# Patient Record
Sex: Female | Born: 1975 | Race: Black or African American | Hispanic: No | Marital: Single | Smoking: Never smoker
Health system: Southern US, Community
[De-identification: ages and names within clinical notes are randomized; demographics above are authoritative.]

## PROBLEM LIST (undated history)

## (undated) DIAGNOSIS — O1494 Unspecified pre-eclampsia, complicating childbirth: Secondary | ICD-10-CM

## (undated) HISTORY — DX: Unspecified pre-eclampsia, complicating childbirth: O14.94

---

## 1998-12-08 ENCOUNTER — Emergency Department (HOSPITAL_COMMUNITY): Admission: EM | Admit: 1998-12-08 | Discharge: 1998-12-08 | Payer: Self-pay | Admitting: Emergency Medicine

## 1998-12-19 ENCOUNTER — Emergency Department (HOSPITAL_COMMUNITY): Admission: EM | Admit: 1998-12-19 | Discharge: 1998-12-19 | Payer: Self-pay | Admitting: Emergency Medicine

## 1999-01-04 ENCOUNTER — Inpatient Hospital Stay (HOSPITAL_COMMUNITY): Admission: AD | Admit: 1999-01-04 | Discharge: 1999-01-04 | Payer: Self-pay | Admitting: *Deleted

## 1999-01-04 ENCOUNTER — Encounter: Payer: Self-pay | Admitting: *Deleted

## 1999-01-07 ENCOUNTER — Inpatient Hospital Stay (HOSPITAL_COMMUNITY): Admission: AD | Admit: 1999-01-07 | Discharge: 1999-01-07 | Payer: Self-pay | Admitting: Obstetrics

## 1999-01-07 ENCOUNTER — Inpatient Hospital Stay (HOSPITAL_COMMUNITY): Admission: AD | Admit: 1999-01-07 | Discharge: 1999-01-07 | Payer: Self-pay | Admitting: Obstetrics & Gynecology

## 2003-05-13 ENCOUNTER — Encounter: Payer: Self-pay | Admitting: Emergency Medicine

## 2003-05-13 ENCOUNTER — Inpatient Hospital Stay (HOSPITAL_COMMUNITY): Admission: AD | Admit: 2003-05-13 | Discharge: 2003-05-13 | Payer: Self-pay | Admitting: Obstetrics and Gynecology

## 2003-05-15 ENCOUNTER — Inpatient Hospital Stay (HOSPITAL_COMMUNITY): Admission: AD | Admit: 2003-05-15 | Discharge: 2003-05-15 | Payer: Self-pay | Admitting: Obstetrics and Gynecology

## 2003-05-18 ENCOUNTER — Inpatient Hospital Stay (HOSPITAL_COMMUNITY): Admission: AD | Admit: 2003-05-18 | Discharge: 2003-05-18 | Payer: Self-pay | Admitting: Obstetrics & Gynecology

## 2003-05-25 ENCOUNTER — Inpatient Hospital Stay (HOSPITAL_COMMUNITY): Admission: AD | Admit: 2003-05-25 | Discharge: 2003-05-25 | Payer: Self-pay | Admitting: Obstetrics & Gynecology

## 2003-06-12 ENCOUNTER — Inpatient Hospital Stay (HOSPITAL_COMMUNITY): Admission: AD | Admit: 2003-06-12 | Discharge: 2003-06-12 | Payer: Self-pay | Admitting: Obstetrics & Gynecology

## 2004-09-14 ENCOUNTER — Emergency Department (HOSPITAL_COMMUNITY): Admission: EM | Admit: 2004-09-14 | Discharge: 2004-09-14 | Payer: Self-pay | Admitting: Emergency Medicine

## 2005-01-06 ENCOUNTER — Emergency Department (HOSPITAL_COMMUNITY): Admission: EM | Admit: 2005-01-06 | Discharge: 2005-01-06 | Payer: Self-pay | Admitting: Emergency Medicine

## 2005-01-25 ENCOUNTER — Emergency Department (HOSPITAL_COMMUNITY): Admission: EM | Admit: 2005-01-25 | Discharge: 2005-01-25 | Payer: Self-pay | Admitting: Emergency Medicine

## 2005-02-27 ENCOUNTER — Emergency Department (HOSPITAL_COMMUNITY): Admission: EM | Admit: 2005-02-27 | Discharge: 2005-02-27 | Payer: Self-pay | Admitting: Emergency Medicine

## 2006-09-10 ENCOUNTER — Inpatient Hospital Stay (HOSPITAL_COMMUNITY): Admission: AD | Admit: 2006-09-10 | Discharge: 2006-09-10 | Payer: Self-pay | Admitting: Obstetrics & Gynecology

## 2006-09-19 ENCOUNTER — Inpatient Hospital Stay (HOSPITAL_COMMUNITY): Admission: AD | Admit: 2006-09-19 | Discharge: 2006-09-19 | Payer: Self-pay | Admitting: Gynecology

## 2006-10-23 ENCOUNTER — Inpatient Hospital Stay (HOSPITAL_COMMUNITY): Admission: AD | Admit: 2006-10-23 | Discharge: 2006-10-23 | Payer: Self-pay | Admitting: Obstetrics and Gynecology

## 2006-10-26 ENCOUNTER — Ambulatory Visit: Payer: Self-pay | Admitting: Obstetrics & Gynecology

## 2006-11-13 ENCOUNTER — Encounter: Payer: Self-pay | Admitting: Family Medicine

## 2006-11-13 ENCOUNTER — Ambulatory Visit: Payer: Self-pay | Admitting: Family Medicine

## 2006-11-30 ENCOUNTER — Ambulatory Visit: Payer: Self-pay | Admitting: Family Medicine

## 2006-11-30 ENCOUNTER — Ambulatory Visit (HOSPITAL_COMMUNITY): Admission: RE | Admit: 2006-11-30 | Discharge: 2006-11-30 | Payer: Self-pay | Admitting: Obstetrics and Gynecology

## 2006-12-17 ENCOUNTER — Encounter: Admission: RE | Admit: 2006-12-17 | Discharge: 2006-12-17 | Payer: Self-pay | Admitting: Obstetrics & Gynecology

## 2007-01-14 ENCOUNTER — Ambulatory Visit: Payer: Self-pay | Admitting: Gynecology

## 2007-01-26 ENCOUNTER — Ambulatory Visit: Payer: Self-pay | Admitting: *Deleted

## 2007-01-26 ENCOUNTER — Inpatient Hospital Stay (HOSPITAL_COMMUNITY): Admission: AD | Admit: 2007-01-26 | Discharge: 2007-01-26 | Payer: Self-pay | Admitting: Obstetrics & Gynecology

## 2007-01-30 ENCOUNTER — Ambulatory Visit: Payer: Self-pay | Admitting: Obstetrics & Gynecology

## 2007-02-11 ENCOUNTER — Ambulatory Visit: Payer: Self-pay | Admitting: *Deleted

## 2007-02-11 ENCOUNTER — Inpatient Hospital Stay (HOSPITAL_COMMUNITY): Admission: AD | Admit: 2007-02-11 | Discharge: 2007-02-11 | Payer: Self-pay | Admitting: Family Medicine

## 2007-02-25 ENCOUNTER — Ambulatory Visit: Payer: Self-pay | Admitting: Gynecology

## 2007-03-14 ENCOUNTER — Ambulatory Visit: Payer: Self-pay | Admitting: Obstetrics and Gynecology

## 2007-03-14 ENCOUNTER — Inpatient Hospital Stay (HOSPITAL_COMMUNITY): Admission: AD | Admit: 2007-03-14 | Discharge: 2007-03-14 | Payer: Self-pay | Admitting: Obstetrics & Gynecology

## 2007-03-18 ENCOUNTER — Ambulatory Visit: Payer: Self-pay | Admitting: Gynecology

## 2007-04-01 ENCOUNTER — Ambulatory Visit: Payer: Self-pay | Admitting: Gynecology

## 2007-04-09 ENCOUNTER — Inpatient Hospital Stay (HOSPITAL_COMMUNITY): Admission: AD | Admit: 2007-04-09 | Discharge: 2007-04-09 | Payer: Self-pay | Admitting: Obstetrics & Gynecology

## 2007-04-09 ENCOUNTER — Ambulatory Visit: Payer: Self-pay | Admitting: Obstetrics and Gynecology

## 2007-04-11 ENCOUNTER — Ambulatory Visit: Payer: Self-pay | Admitting: Obstetrics & Gynecology

## 2007-04-17 ENCOUNTER — Ambulatory Visit: Payer: Self-pay | Admitting: Obstetrics & Gynecology

## 2007-04-23 ENCOUNTER — Ambulatory Visit: Payer: Self-pay | Admitting: Family Medicine

## 2007-04-23 ENCOUNTER — Inpatient Hospital Stay (HOSPITAL_COMMUNITY): Admission: AD | Admit: 2007-04-23 | Discharge: 2007-04-25 | Payer: Self-pay | Admitting: Gynecology

## 2007-04-30 ENCOUNTER — Ambulatory Visit: Payer: Self-pay | Admitting: Family Medicine

## 2007-06-04 ENCOUNTER — Ambulatory Visit: Payer: Self-pay | Admitting: Family Medicine

## 2007-06-05 ENCOUNTER — Ambulatory Visit: Payer: Self-pay | Admitting: Obstetrics & Gynecology

## 2007-07-15 ENCOUNTER — Ambulatory Visit: Payer: Self-pay | Admitting: Gynecology

## 2008-02-15 ENCOUNTER — Emergency Department (HOSPITAL_COMMUNITY): Admission: EM | Admit: 2008-02-15 | Discharge: 2008-02-15 | Payer: Self-pay | Admitting: Emergency Medicine

## 2008-04-25 ENCOUNTER — Emergency Department (HOSPITAL_COMMUNITY): Admission: EM | Admit: 2008-04-25 | Discharge: 2008-04-25 | Payer: Self-pay | Admitting: Emergency Medicine

## 2010-12-18 ENCOUNTER — Encounter: Payer: Self-pay | Admitting: Obstetrics & Gynecology

## 2011-04-11 NOTE — Assessment & Plan Note (Signed)
NAMEMARYELIZABETH, Bridget Ortiz NO.:  [id number]   MEDICAL RECORD NO.:  [id number]          PATIENT TYPE:  POB   LOCATION:  [facility] at [facility]         FACILITY:  [facility]   PHYSICIAN:  Tinnie Gens, MD        DATE OF BIRTH:  [birth year]   DATE OF SERVICE:  06/04/2007                                  CLINIC NOTE   CHIEF COMPLAINT:  Postpartum check   HISTORY OF PRESENT ILLNESS:  The patient is a 35 year old gravida 2,  para 2 who is 7 weeks postpartum from a vaginal delivery of a female  infant. The baby is not sleeping well through the night, and she is  feeling exceptionally fatigued.  She denies depression or anxiety.  She  is bottle feeding.  She is on no birth control.  She has had sex one-  time with the use of condoms.  She desires an IUD insertion and states  that she is scheduled to return for that tomorrow.   ON EXAM:  Her vitals are as in the chart.  GENERAL:  She is well-nourished female in no acute stress.  ABDOMEN:  Soft, nontender, nondistended.  GU:  Normal external female genitalia.  BUS is normal.  Vagina is pink  and rugated.  Cervix is parous without lesions.  Uterus small,  anteverted.  ADNEXA:  No adnexal mass or tenderness.   IMPRESSION:  Postpartum check, doing well.   PLAN:  IUD insertion.  The patient is cautioned not to have sex without  the use of a condom until her IUD is inserted. The patient also needs a  Pap smear December 2008.           ______________________________  Tinnie Gens, MD     TP/MEDQ  D:  06/04/2007  T:  06/04/2007  Job:  [id number]

## 2011-05-03 ENCOUNTER — Ambulatory Visit: Payer: Self-pay | Admitting: Obstetrics & Gynecology

## 2011-06-14 ENCOUNTER — Ambulatory Visit (INDEPENDENT_AMBULATORY_CARE_PROVIDER_SITE_OTHER): Payer: Self-pay | Admitting: *Deleted

## 2011-06-14 ENCOUNTER — Encounter: Payer: Self-pay | Admitting: Obstetrics & Gynecology

## 2011-06-14 VITALS — BP 110/70 | Ht 66.0 in | Wt 162.0 lb

## 2011-06-14 DIAGNOSIS — Z0184 Encounter for antibody response examination: Secondary | ICD-10-CM

## 2011-06-14 DIAGNOSIS — Z0000 Encounter for general adult medical examination without abnormal findings: Secondary | ICD-10-CM

## 2011-06-14 DIAGNOSIS — Z1272 Encounter for screening for malignant neoplasm of vagina: Secondary | ICD-10-CM

## 2011-06-14 DIAGNOSIS — Z111 Encounter for screening for respiratory tuberculosis: Secondary | ICD-10-CM

## 2011-06-14 DIAGNOSIS — Z01419 Encounter for gynecological examination (general) (routine) without abnormal findings: Secondary | ICD-10-CM

## 2011-06-14 DIAGNOSIS — Z23 Encounter for immunization: Secondary | ICD-10-CM

## 2011-06-14 NOTE — Progress Notes (Signed)
  Subjective:     Bridget Ortiz is a 35 y.o. female here for a routine exam.  Current complaints: None.   Gynecologic History No LMP recorded. Patient is not currently having periods (Reason: IUD). Contraception: IUD Last Pap: 4 years ago. Results were: normal.  Obstetric History OB History    Grav Para Term Preterm Abortions TAB SAB Ect Mult Living   2 2 2  0 0 0 0 0 0 2     # Outc Date GA Lbr Len/2nd Wgt Sex Del Anes PTL Lv   1 TRM      SVD      2 TRM      SVD        PMH/PSH: None  Review of Systems A comprehensive review of systems was negative.    Objective:    BP 110/70  Ht 5\' 6"  (1.676 m)  Wt 162 lb (73.483 kg)  BMI 26.15 kg/m2  General Appearance:    Alert, cooperative, no distress, appears stated age  Head:    Normocephalic, without obvious abnormality, atraumatic  Eyes:    PERRL, conjunctiva/corneas clear, EOM's intact, fundi    benign, both eyes  Ears:    Normal TM's and external ear canals, both ears  Nose:   Nares normal, septum midline, mucosa normal, no drainage    or sinus tenderness  Throat:   Lips, mucosa, and tongue normal; teeth and gums normal  Neck:   Supple, symmetrical, trachea midline, no adenopathy;    thyroid:  no enlargement/tenderness/nodules; no carotid   bruit or JVD  Back:     Symmetric, no curvature, ROM normal, no CVA tenderness  Lungs:     Clear to auscultation bilaterally, respirations unlabored  Chest Wall:    No tenderness or deformity   Heart:    Regular rate and rhythm, S1 and S2 normal, no murmur, rub   or gallop  Breast Exam:    No tenderness, masses, or nipple abnormality  Abdomen:     Soft, non-tender, bowel sounds active all four quadrants,    no masses, no organomegaly  Genitalia:    Normal female without lesion, discharge or tenderness   Normal uterus, normal adnexa, no tenderness or abnormal masses  Rectal:    Normal tone, normal prostate, no masses or tenderness;   guaiac negative stool  Extremities:   Extremities  normal, atraumatic, no cyanosis or edema  Pulses:   2+ and symmetric all extremities  Skin:   Skin color, texture, turgor normal, no rashes or lesions  Lymph nodes:   Cervical, supraclavicular, and axillary nodes normal  Neurologic:   CNII-XII intact, normal strength, sensation and reflexes    throughout      Assessment:    Healthy female exam.    Plan:    Contraception: IUD.   Mirena IUD to be removed June 2013.

## 2011-06-14 NOTE — Patient Instructions (Addendum)

## 2011-06-26 ENCOUNTER — Ambulatory Visit (INDEPENDENT_AMBULATORY_CARE_PROVIDER_SITE_OTHER): Payer: 59 | Admitting: *Deleted

## 2011-06-26 DIAGNOSIS — Z23 Encounter for immunization: Secondary | ICD-10-CM

## 2011-08-21 LAB — RAPID STREP SCREEN (MED CTR MEBANE ONLY): Streptococcus, Group A Screen (Direct): NEGATIVE

## 2011-08-23 LAB — RAPID STREP SCREEN (MED CTR MEBANE ONLY): Streptococcus, Group A Screen (Direct): POSITIVE — AB

## 2011-09-25 ENCOUNTER — Ambulatory Visit: Payer: 59

## 2012-05-09 ENCOUNTER — Encounter: Payer: Self-pay | Admitting: Obstetrics & Gynecology

## 2012-05-09 ENCOUNTER — Ambulatory Visit (INDEPENDENT_AMBULATORY_CARE_PROVIDER_SITE_OTHER): Payer: 59 | Admitting: Obstetrics & Gynecology

## 2012-05-09 VITALS — BP 98/70 | HR 81 | Ht 66.0 in | Wt 180.0 lb

## 2012-05-09 DIAGNOSIS — Z30433 Encounter for removal and reinsertion of intrauterine contraceptive device: Secondary | ICD-10-CM

## 2012-05-09 DIAGNOSIS — Z975 Presence of (intrauterine) contraceptive device: Secondary | ICD-10-CM

## 2012-05-09 NOTE — Progress Notes (Signed)
  Subjective:    Patient ID: Bridget Ortiz, female    DOB: July 04, 1976, 36 y.o.   MRN: [id number]  HPI  She is here for removal of old Mirena and insertion of new one. We discussed a BTL as she want no more children, but she has a h/o dysmenorrhea and likes the effect of Mirena on her periods (She spots for a few days each month). She has no complaints today.  Review of Systems Her pap was normal 2012.    Objective:   Physical Exam  Consent was signed. Questions were answered. Her old Mirena was removed intact. Her cervix was prepped with betadine. The new Mirena was easily placed. The strings were cut to 3-4 cm. She tolerated the procedure well.     Assessment & Plan:  Contraception- Mirena RTC 1 month for string check

## 2012-06-06 ENCOUNTER — Ambulatory Visit: Payer: 59 | Admitting: Obstetrics & Gynecology

## 2012-06-28 ENCOUNTER — Ambulatory Visit: Payer: 59 | Admitting: Family Medicine

## 2014-09-28 ENCOUNTER — Encounter: Payer: Self-pay | Admitting: Obstetrics & Gynecology

## 2015-01-21 ENCOUNTER — Encounter (HOSPITAL_COMMUNITY): Payer: Self-pay | Admitting: Emergency Medicine

## 2015-01-21 ENCOUNTER — Emergency Department (HOSPITAL_COMMUNITY)
Admission: EM | Admit: 2015-01-21 | Discharge: 2015-01-21 | Disposition: A | Discharge status: Home / Self Care | Payer: 59 | Attending: Emergency Medicine | Admitting: Emergency Medicine

## 2015-01-21 DIAGNOSIS — Z793 Long term (current) use of hormonal contraceptives: Secondary | ICD-10-CM | POA: Diagnosis not present

## 2015-01-21 DIAGNOSIS — J069 Acute upper respiratory infection, unspecified: Secondary | ICD-10-CM | POA: Diagnosis not present

## 2015-01-21 DIAGNOSIS — R05 Cough: Secondary | ICD-10-CM | POA: Diagnosis present

## 2015-01-21 MED ORDER — GUAIFENESIN-CODEINE 100-10 MG/5ML PO SOLN: 10.0000 mL | Freq: Four times a day (QID) | ORAL | Status: AC | PRN

## 2015-01-21 NOTE — ED Provider Notes (Signed)
CSN: [id number]     Arrival date & time 01/21/15  0006 History  This chart was scribed for Geoffery Lyonsouglas , MD by Luisa DagoPriscilla Tutu, ED Scribe. This patient was seen in room A06C/A06C and the patient's care was started at 1:26 AM.   Chief Complaint  Patient presents with  . Cough  . Generalized Body Aches   The history is provided by the patient and medical records. No language interpreter was used.   HPI Comments: Ephriam Knucklesnita D Tierney is a 39 y.o. female who presents to the Emergency Department complaining of gradual onset persistent productive cough which started 4 days ago. She states that her symptoms started with a "scratchy" throat but now she has generalized myalgias and a mild HA. Pt reports taking Ibuprofen and keeping hydrated to alleviate her symptoms. She states that she was concerned because her symptoms have been persistent and not resolving. Ms. Dimas AguasHoward endorses multiple possible sick contacts. Pt denies any fever, neck pain,  visual disturbance, CP, SOB, abdominal pain, nausea, emesis, diarrhea, urinary symptoms, back pain, HA, weakness, numbness and rash as associated symptoms.     Past Medical History  Diagnosis Date  . Hypertension in pregnancy, preeclampsia, delivered    History reviewed. No pertinent past surgical history. Family History  Problem Relation Age of Onset  . Diabetes Mother   . Hypertension Mother   . Cancer Mother     lympnodes  . Heart disease Father   . Cancer Maternal Grandfather     lung   History  Substance Use Topics  . Smoking status: Never Smoker   . Smokeless tobacco: Not on file  . Alcohol Use: 0.0 oz/week    1-2 Glasses of wine per week     Comment: occassionally   OB History    Gravida Para Term Preterm AB TAB SAB Ectopic Multiple Living   2 2 2  0 0 0 0 0 0 2     Review of Systems  Constitutional: Negative for fever and chills.  HENT: Negative for rhinorrhea and sore throat.   Eyes: Negative for visual disturbance.  Respiratory: Negative  for cough and shortness of breath.   Cardiovascular: Negative for chest pain and leg swelling.  Gastrointestinal: Negative for nausea, vomiting, abdominal pain and diarrhea.  Genitourinary: Negative for dysuria.  Musculoskeletal: Negative for back pain and neck pain.  Skin: Negative for rash.  Neurological: Negative for dizziness, light-headedness and headaches.  Hematological: Does not bruise/bleed easily.  Psychiatric/Behavioral: Negative for confusion.      Allergies  Review of patient's allergies indicates no known allergies.  Home Medications   Prior to Admission medications   Medication Sig Start Date End Date Taking? Authorizing Provider  levonorgestrel (MIRENA) 20 MCG/24HR IUD 1 each by Intrauterine route once.    Historical Provider, MD   BP 128/67 mmHg  Pulse 84  Temp(Src) 98.8 F (37.1 C)  Resp 20  SpO2 97%  Physical Exam  Constitutional: She is oriented to person, place, and time. She appears well-developed and well-nourished. No distress.  HENT:  Head: Normocephalic and atraumatic.  Right Ear: External ear normal.  Left Ear: External ear normal.  Nose: Nose normal.  Mouth/Throat: Oropharynx is clear and moist. No oropharyngeal exudate.  Eyes: Conjunctivae and EOM are normal. Pupils are equal, round, and reactive to light. Right eye exhibits no discharge. Left eye exhibits no discharge.  Neck: Neck supple.  Cardiovascular: Normal rate, regular rhythm and normal heart sounds.  Exam reveals no gallop and no friction rub.  No murmur heard. Pulmonary/Chest: Effort normal and breath sounds normal. No respiratory distress. She has no wheezes. She has no rales.  Abdominal: Soft. Bowel sounds are normal. She exhibits no distension. There is no tenderness.  Musculoskeletal: She exhibits no edema or tenderness.  Neurological: She is alert and oriented to person, place, and time.  Skin: Skin is warm and dry.  Psychiatric: She has a normal mood and affect. Her behavior  is normal. Thought content normal.  Nursing note and vitals reviewed.   ED Course  Procedures (including critical care time)  DIAGNOSTIC STUDIES: Oxygen Saturation is 97% on RA, adequate by my interpretation.    COORDINATION OF CARE: 1:30 AM- Advised pt to keep hydrated. Will prescribe cough medicine. Pt advised of plan for treatment and pt agrees.  Labs Review Labs Reviewed - No data to display  Imaging Review No results found.   EKG Interpretation None      MDM   Final diagnoses:  None    Symptoms likely viral in nature. There is no hypoxia, respiratory distress, and lungs are clear. Will treat with cough medication and when necessary return.  I personally performed the services described in this documentation, which was scribed in my presence. The recorded information has been reviewed and is accurate.      Geoffery Lyons, MD 01/21/15 [phone]

## 2015-01-21 NOTE — ED Notes (Signed)
Pt c/o productive cough, body aches, headache since Sunday. Pt taking ibuprofen at home

## 2015-01-21 NOTE — Discharge Instructions (Signed)
Robitussin with codeine as needed for cough.  Return to the emergency department if you develop chest pain, difficulty breathing, or other new and concerning symptoms.   Upper Respiratory Infection, Adult An upper respiratory infection (URI) is also sometimes known as the common cold. The upper respiratory tract includes the nose, sinuses, throat, trachea, and bronchi. Bronchi are the airways leading to the lungs. Most people improve within 1 week, but symptoms can last up to 2 weeks. A residual cough may last even longer.  CAUSES Many different viruses can infect the tissues lining the upper respiratory tract. The tissues become irritated and inflamed and often become very moist. Mucus production is also common. A cold is contagious. You can easily spread the virus to others by oral contact. This includes kissing, sharing a glass, coughing, or sneezing. Touching your mouth or nose and then touching a surface, which is then touched by another person, can also spread the virus. SYMPTOMS  Symptoms typically develop 1 to 3 days after you come in contact with a cold virus. Symptoms vary from person to person. They may include:  Runny nose.  Sneezing.  Nasal congestion.  Sinus irritation.  Sore throat.  Loss of voice (laryngitis).  Cough.  Fatigue.  Muscle aches.  Loss of appetite.  Headache.  Low-grade fever. DIAGNOSIS  You might diagnose your own cold based on familiar symptoms, since most people get a cold 2 to 3 times a year. Your caregiver can confirm this based on your exam. Most importantly, your caregiver can check that your symptoms are not due to another disease such as strep throat, sinusitis, pneumonia, asthma, or epiglottitis. Blood tests, throat tests, and X-rays are not necessary to diagnose a common cold, but they may sometimes be helpful in excluding other more serious diseases. Your caregiver will decide if any further tests are required. RISKS AND COMPLICATIONS    You may be at risk for a more severe case of the common cold if you smoke cigarettes, have chronic heart disease (such as heart failure) or lung disease (such as asthma), or if you have a weakened immune system. The very young and very old are also at risk for more serious infections. Bacterial sinusitis, middle ear infections, and bacterial pneumonia can complicate the common cold. The common cold can worsen asthma and chronic obstructive pulmonary disease (COPD). Sometimes, these complications can require emergency medical care and may be life-threatening. PREVENTION  The best way to protect against getting a cold is to practice good hygiene. Avoid oral or hand contact with people with cold symptoms. Wash your hands often if contact occurs. There is no clear evidence that vitamin C, vitamin E, echinacea, or exercise reduces the chance of developing a cold. However, it is always recommended to get plenty of rest and practice good nutrition. TREATMENT  Treatment is directed at relieving symptoms. There is no cure. Antibiotics are not effective, because the infection is caused by a virus, not by bacteria. Treatment may include:  Increased fluid intake. Sports drinks offer valuable electrolytes, sugars, and fluids.  Breathing heated mist or steam (vaporizer or shower).  Eating chicken soup or other clear broths, and maintaining good nutrition.  Getting plenty of rest.  Using gargles or lozenges for comfort.  Controlling fevers with ibuprofen or acetaminophen as directed by your caregiver.  Increasing usage of your inhaler if you have asthma. Zinc gel and zinc lozenges, taken in the first 24 hours of the common cold, can shorten the duration and lessen the  severity of symptoms. Pain medicines may help with fever, muscle aches, and throat pain. A variety of non-prescription medicines are available to treat congestion and runny nose. Your caregiver can make recommendations and may suggest nasal or  lung inhalers for other symptoms.  HOME CARE INSTRUCTIONS   Only take over-the-counter or prescription medicines for pain, discomfort, or fever as directed by your caregiver.  Use a warm mist humidifier or inhale steam from a shower to increase air moisture. This may keep secretions moist and make it easier to breathe.  Drink enough water and fluids to keep your urine clear or pale yellow.  Rest as needed.  Return to work when your temperature has returned to normal or as your caregiver advises. You may need to stay home longer to avoid infecting others. You can also use a face mask and careful hand washing to prevent spread of the virus. SEEK MEDICAL CARE IF:   After the first few days, you feel you are getting worse rather than better.  You need your caregiver's advice about medicines to control symptoms.  You develop chills, worsening shortness of breath, or brown or red sputum. These may be signs of pneumonia.  You develop yellow or brown nasal discharge or pain in the face, especially when you bend forward. These may be signs of sinusitis.  You develop a fever, swollen neck glands, pain with swallowing, or white areas in the back of your throat. These may be signs of strep throat. SEEK IMMEDIATE MEDICAL CARE IF:   You have a fever.  You develop severe or persistent headache, ear pain, sinus pain, or chest pain.  You develop wheezing, a prolonged cough, cough up blood, or have a change in your usual mucus (if you have chronic lung disease).  You develop sore muscles or a stiff neck. Document Released: 05/09/2001 Document Revised: 02/05/2012 Document Reviewed: 02/18/2014 [facility] Patient Information 2015 [place], [place]. This information is not intended to replace advice given to you by your health care provider. Make sure you discuss any questions you have with your health care provider.

## 2015-01-21 NOTE — ED Notes (Signed)
EDP at bedside  

## 2016-06-23 ENCOUNTER — Other Ambulatory Visit: Payer: Self-pay | Admitting: Obstetrics & Gynecology

## 2016-06-23 ENCOUNTER — Other Ambulatory Visit (HOSPITAL_COMMUNITY)
Admission: RE | Admit: 2016-06-23 | Discharge: 2016-06-23 | Disposition: A | Discharge status: Home / Self Care | Payer: BC Managed Care – PPO | Source: Ambulatory Visit | Attending: Obstetrics & Gynecology | Admitting: Obstetrics & Gynecology

## 2016-06-23 DIAGNOSIS — Z01419 Encounter for gynecological examination (general) (routine) without abnormal findings: Secondary | ICD-10-CM | POA: Diagnosis present

## 2016-06-23 DIAGNOSIS — Z1151 Encounter for screening for human papillomavirus (HPV): Secondary | ICD-10-CM | POA: Diagnosis present

## 2016-06-27 LAB — CYTOLOGY - PAP

## 2019-05-29 ENCOUNTER — Other Ambulatory Visit: Payer: Self-pay | Admitting: Family Medicine

## 2019-05-29 DIAGNOSIS — Z1231 Encounter for screening mammogram for malignant neoplasm of breast: Secondary | ICD-10-CM

## 2019-07-10 ENCOUNTER — Ambulatory Visit
Admission: RE | Admit: 2019-07-10 | Discharge: 2019-07-10 | Disposition: A | Discharge status: Home / Self Care | Payer: BC Managed Care – PPO | Source: Ambulatory Visit | Attending: Family Medicine | Admitting: Family Medicine

## 2019-07-10 ENCOUNTER — Other Ambulatory Visit: Payer: Self-pay

## 2019-07-10 DIAGNOSIS — Z1231 Encounter for screening mammogram for malignant neoplasm of breast: Secondary | ICD-10-CM

## 2019-11-10 IMAGING — MG DIGITAL SCREENING BILATERAL MAMMOGRAM WITH TOMO AND CAD
8 series · 9 of 24 positions shown · non-contrast
Comparison: Previous exam(s).

CLINICAL DATA: Screening.

EXAM:
DIGITAL SCREENING BILATERAL MAMMOGRAM WITH TOMO AND CAD

[R CC synth-2D]
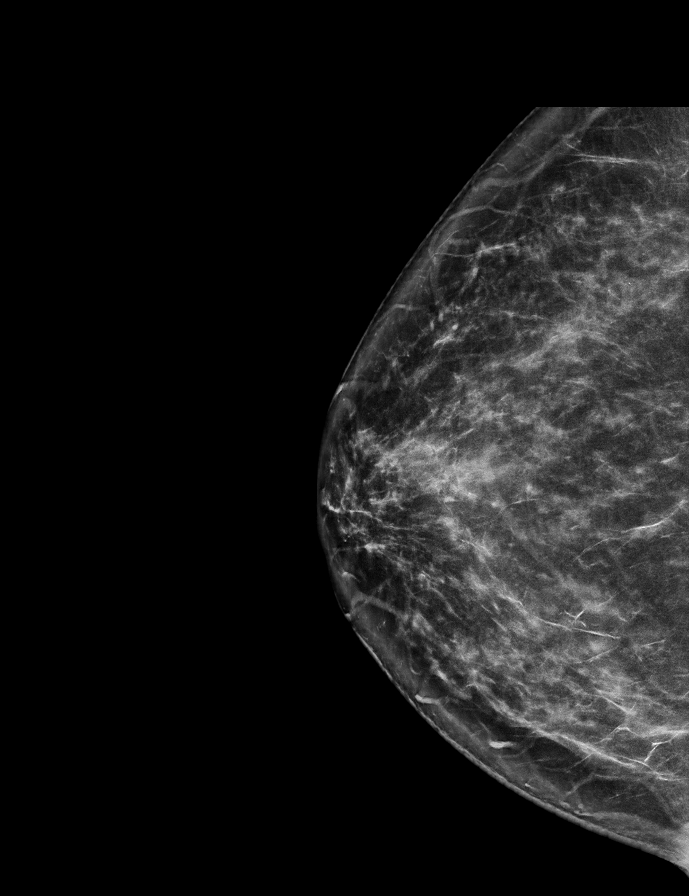

[L MLO synth-2D]
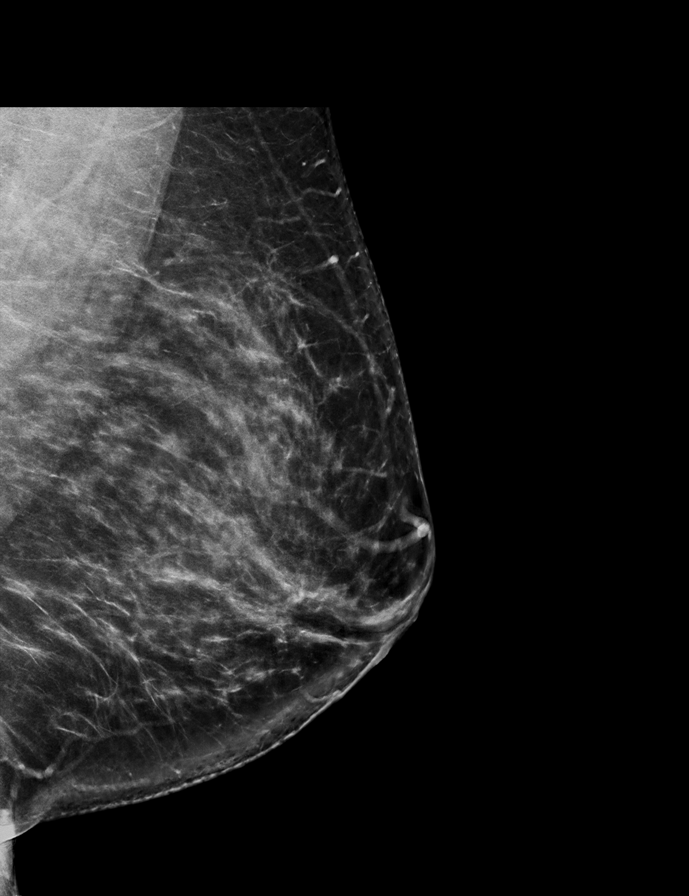

[R MLO synth-2D]
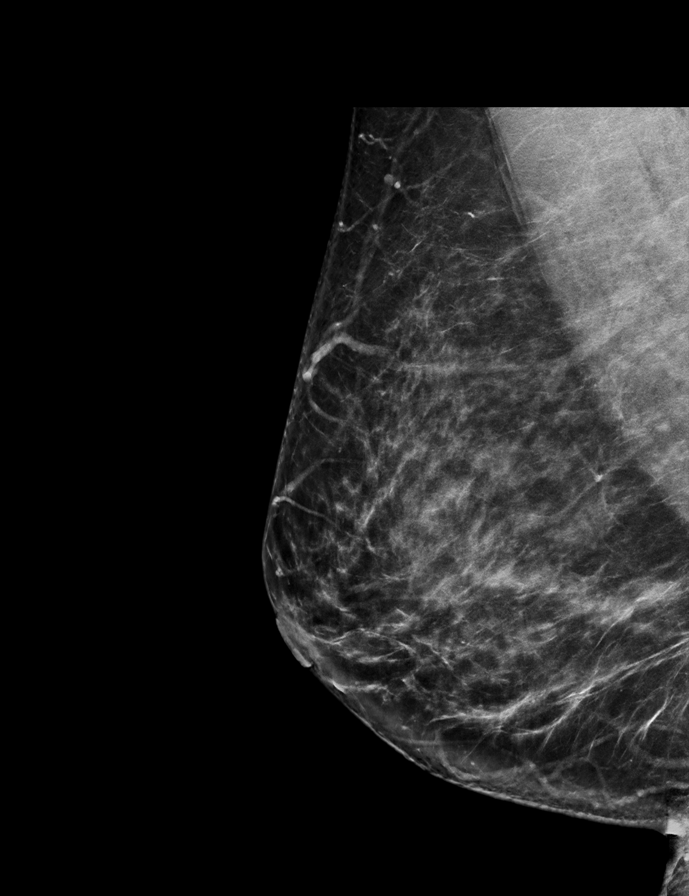

[L CC synth-2D]
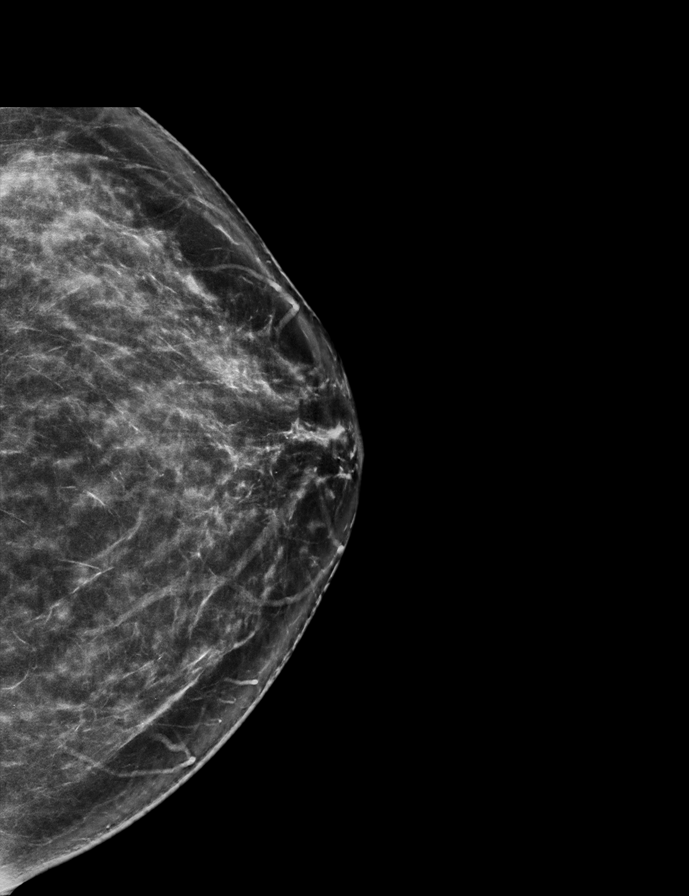

[R MLO tomo · 2 of 83 frames shown]
[frame 27/83]
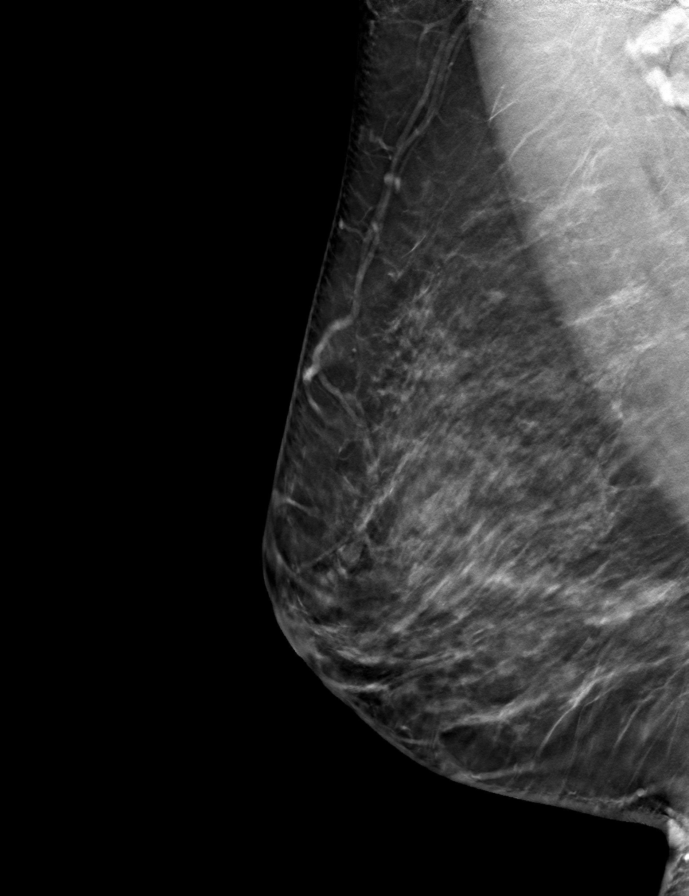
[frame 42/83]
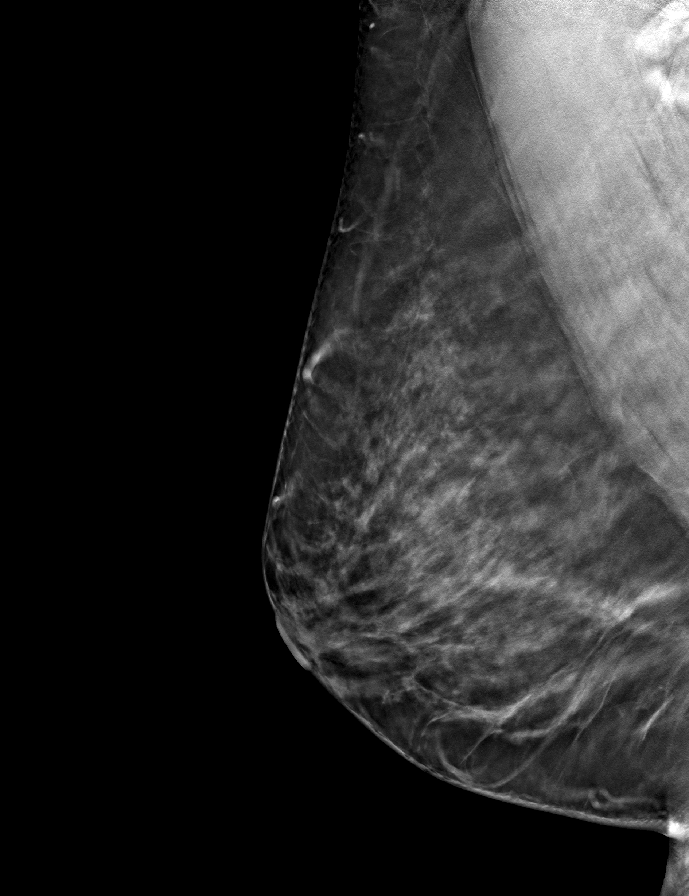

[R CC tomo · tomo slice 39/77.0]
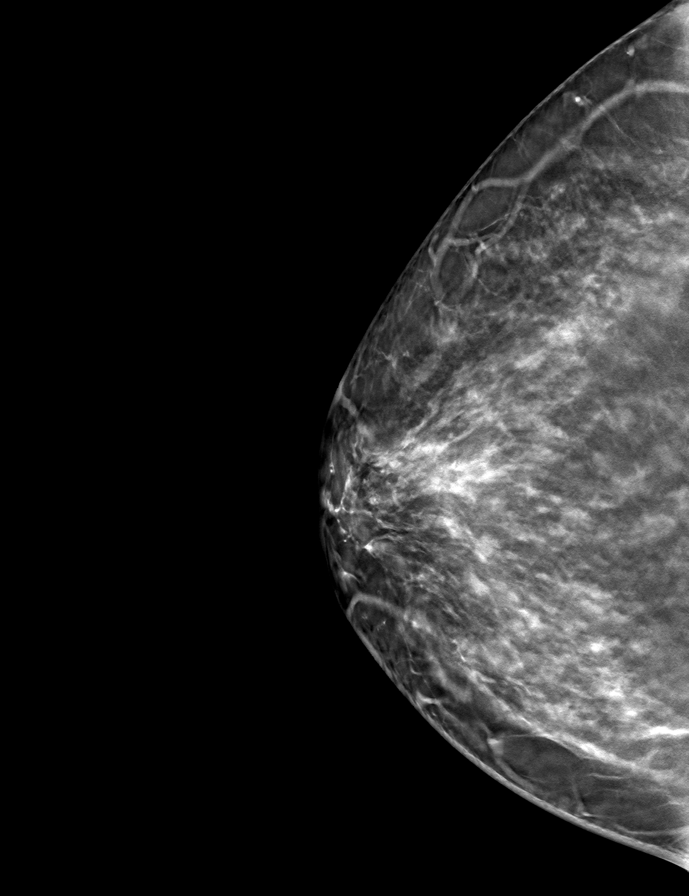

[L CC tomo · tomo slice 37/74.0]
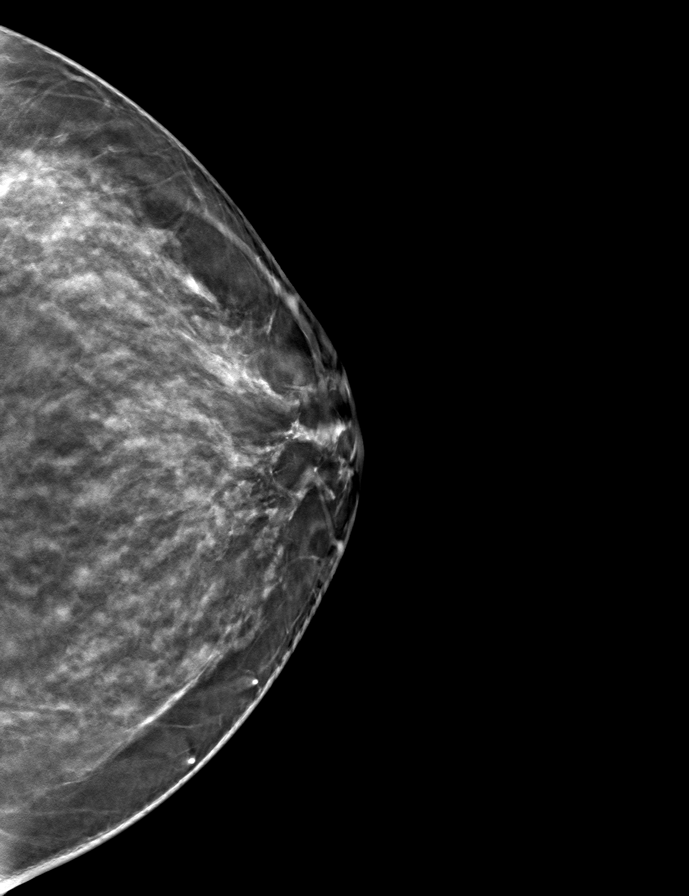

[L MLO tomo · tomo slice 43/84.0]
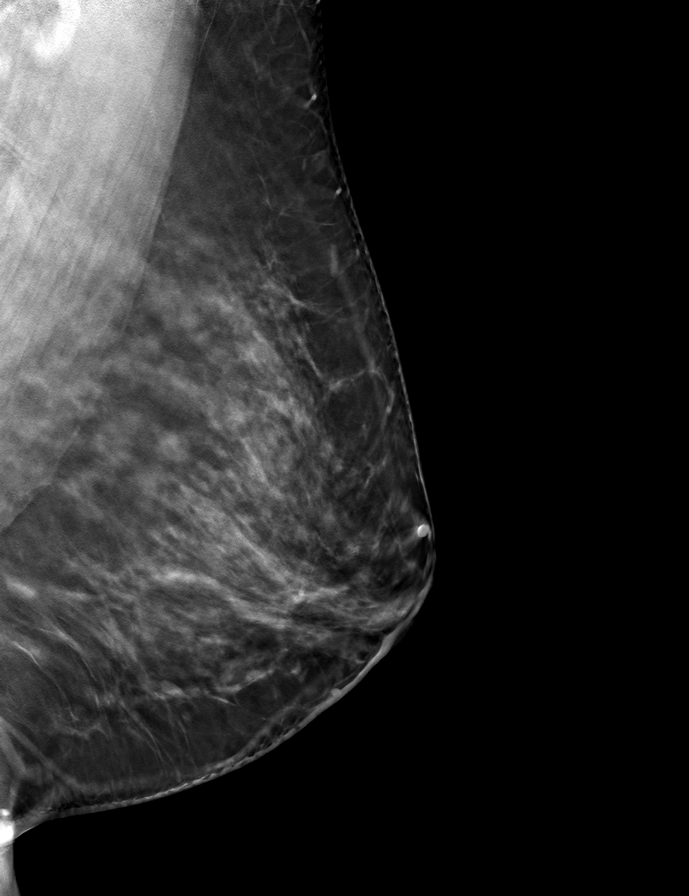

[9 of 24 positions shown; findings below may reference images not displayed]

ACR Breast Density Category c: The breast tissue is heterogeneously
dense, which may obscure small masses.
FINDINGS: There are no findings suspicious for malignancy. Images were
processed with CAD.
IMPRESSION: No mammographic evidence of malignancy. A result letter of this
screening mammogram will be mailed directly to the patient.

RECOMMENDATION:
Screening mammogram in one year. (Code:[id number])

BI-RADS CATEGORY  1: Negative.

## 2019-12-08 ENCOUNTER — Other Ambulatory Visit: Payer: Self-pay

## 2019-12-08 DIAGNOSIS — Z20822 Contact with and (suspected) exposure to covid-19: Secondary | ICD-10-CM

## 2019-12-10 LAB — NOVEL CORONAVIRUS, NAA: SARS-CoV-2, NAA: NOT DETECTED

## 2020-02-21 ENCOUNTER — Ambulatory Visit: Payer: BC Managed Care – PPO | Attending: Internal Medicine

## 2020-02-21 DIAGNOSIS — Z23 Encounter for immunization: Secondary | ICD-10-CM

## 2020-02-21 NOTE — Progress Notes (Signed)
   Covid-19 Vaccination Clinic  Name:  [name]    MRN: [id number] DOB: 20-Jul-1976  02/21/2020  Ms. Krigbaum was observed post Covid-19 immunization for 15 minutes without incident. She was provided with Vaccine Information Sheet and instruction to access the V-Safe system.   Ms. Bittick was instructed to call 911 with any severe reactions post vaccine: Marland Kitchen Difficulty breathing  . Swelling of face and throat  . A fast heartbeat  . A bad rash all over body  . Dizziness and weakness   Immunizations Administered    Name Date Dose VIS Date Route   Pfizer COVID-19 Vaccine 02/21/2020  1:54 PM 0.3 mL 11/07/2019 Intramuscular   Manufacturer: [employer], [employer]   Lot: [id number]   NDC: [address]-4

## 2020-03-17 ENCOUNTER — Ambulatory Visit: Payer: BC Managed Care – PPO | Attending: Internal Medicine

## 2020-03-17 DIAGNOSIS — Z23 Encounter for immunization: Secondary | ICD-10-CM

## 2020-03-17 NOTE — Progress Notes (Signed)
   Covid-19 Vaccination Clinic  Name:  Bridget Ortiz    MRN: [id number] DOB: 11-Mar-1976  03/17/2020  [name] was observed post Covid-19 immunization for 15 minutes without incident. She was provided with Vaccine Information Sheet and instruction to access the V-Safe system.   [name] was instructed to call 911 with any severe reactions post vaccine: Marland Kitchen Difficulty breathing  . Swelling of face and throat  . A fast heartbeat  . A bad rash all over body  . Dizziness and weakness   Immunizations Administered    Name Date Dose VIS Date Route   Pfizer COVID-19 Vaccine 03/17/2020  3:52 PM 0.3 mL 01/21/2019 Intramuscular   Manufacturer: [employer], [employer]   Lot: [id number]   NDC: [address]-2

## 2020-10-13 ENCOUNTER — Other Ambulatory Visit: Payer: Self-pay | Admitting: Family Medicine

## 2020-10-13 DIAGNOSIS — Z1231 Encounter for screening mammogram for malignant neoplasm of breast: Secondary | ICD-10-CM

## 2020-10-19 ENCOUNTER — Ambulatory Visit
Admission: RE | Admit: 2020-10-19 | Discharge: 2020-10-19 | Disposition: A | Discharge status: Home / Self Care | Payer: BC Managed Care – PPO | Source: Ambulatory Visit | Attending: Family Medicine | Admitting: Family Medicine

## 2020-10-19 ENCOUNTER — Other Ambulatory Visit: Payer: Self-pay

## 2020-10-19 DIAGNOSIS — Z1231 Encounter for screening mammogram for malignant neoplasm of breast: Secondary | ICD-10-CM

## 2020-12-03 ENCOUNTER — Ambulatory Visit: Payer: Self-pay | Attending: Internal Medicine

## 2020-12-03 DIAGNOSIS — Z23 Encounter for immunization: Secondary | ICD-10-CM

## 2020-12-03 NOTE — Progress Notes (Signed)
   Covid-19 Vaccination Clinic  Name:  Bridget Ortiz    MRN: [id number] DOB: 02-16-1976  12/03/2020  Ms. Bridget Ortiz was observed post Covid-19 immunization for 15 minutes without incident. She was provided with Vaccine Information Sheet and instruction to access the V-Safe system.   Ms. Bridget Ortiz was instructed to call 911 with any severe reactions post vaccine: Marland Kitchen Difficulty breathing  . Swelling of face and throat  . A fast heartbeat  . A bad rash all over body  . Dizziness and weakness   Immunizations Administered    Name Date Dose VIS Date Route   Pfizer COVID-19 Vaccine 12/03/2020  5:04 PM 0.3 mL 09/15/2020 Intramuscular   Manufacturer: [employer], [employer]   Lot: [id number]   NDC: [address]-5

## 2021-09-23 ENCOUNTER — Other Ambulatory Visit: Payer: Self-pay | Admitting: Family Medicine

## 2021-09-23 DIAGNOSIS — Z1231 Encounter for screening mammogram for malignant neoplasm of breast: Secondary | ICD-10-CM

## 2021-10-27 ENCOUNTER — Ambulatory Visit
Admission: RE | Admit: 2021-10-27 | Discharge: 2021-10-27 | Disposition: A | Discharge status: Home / Self Care | Payer: BC Managed Care – PPO | Source: Ambulatory Visit | Attending: Family Medicine | Admitting: Family Medicine

## 2021-10-27 DIAGNOSIS — Z1231 Encounter for screening mammogram for malignant neoplasm of breast: Secondary | ICD-10-CM

## 2023-01-11 ENCOUNTER — Other Ambulatory Visit: Payer: Self-pay | Admitting: Family Medicine

## 2023-01-11 DIAGNOSIS — Z1231 Encounter for screening mammogram for malignant neoplasm of breast: Secondary | ICD-10-CM

## 2023-03-05 ENCOUNTER — Ambulatory Visit
Admission: RE | Admit: 2023-03-05 | Discharge: 2023-03-05 | Disposition: A | Discharge status: Home / Self Care | Payer: BC Managed Care – PPO | Source: Ambulatory Visit | Attending: Family Medicine | Admitting: Family Medicine

## 2023-03-05 DIAGNOSIS — Z1231 Encounter for screening mammogram for malignant neoplasm of breast: Secondary | ICD-10-CM

## 2023-06-08 ENCOUNTER — Ambulatory Visit: Payer: BC Managed Care – PPO | Admitting: Podiatry

## 2023-06-28 ENCOUNTER — Ambulatory Visit: Payer: BC Managed Care – PPO | Admitting: Podiatry

## 2023-06-28 DIAGNOSIS — L603 Nail dystrophy: Secondary | ICD-10-CM | POA: Diagnosis not present

## 2023-06-28 DIAGNOSIS — C439 Malignant melanoma of skin, unspecified: Secondary | ICD-10-CM | POA: Diagnosis not present

## 2023-06-28 DIAGNOSIS — M25472 Effusion, left ankle: Secondary | ICD-10-CM | POA: Diagnosis not present

## 2023-06-28 MED ORDER — METHYLPREDNISOLONE 4 MG PO TBPK: ORAL_TABLET | ORAL | 0 refills | Status: AC

## 2023-06-28 NOTE — Progress Notes (Signed)
  Subjective:  Patient ID: Bridget Ortiz, female    DOB: 02/15/1976,  MRN: [id number]  Chief Complaint  Patient presents with   Nail Problem    Left hallux nail change, dark lines and a dark spot. No pain. No known injuries. Not diabetic.    Joint Swelling    Left ankle swelling during the day but subsides when she lays down. Denies any pain. Does not wear any compression socks or anklets.      47 y.o. female presents with concern for left hallux nail change.  There is dark line in a dark spot running in the nail.  She denies any pain in the left great toe.  No known injury to the toe.  She also notes some left ankle swelling during the day but subsides when she lays down.  She does not wear any compression socks or ankle brace.  Past Medical History:  Diagnosis Date   Hypertension in pregnancy, preeclampsia, delivered     Allergies  Allergen Reactions   Citalopram Hydrobromide Diarrhea    ROS: Negative except as per HPI above  Objective:  General: AAO x3, NAD  Dermatological:  attention directed to the left hallux there is noted to be black line discoloration running longitudinally down the central aspect of the left hallux nail.  There is also a black circular/oval shaped lesion in the left great toenail.  Upon debridement of some of the nail it does not appear to be in the nailbed but more in the nail or dried blood underneath the nail.  Vascular:  Dorsalis Pedis artery and Posterior Tibial artery pedal pulses are 2/4 bilateral.  Capillary fill time < 3 sec to all digits.   Neruologic: Grossly intact via light touch bilateral. Protective threshold intact to all sites bilateral.   Musculoskeletal: Mild edema noted of the left ankle  Gait: Unassisted, Nonantalgic.   No images are attached to the encounter.  Assessment:   1. Nail dystrophy   2. Subungual malignant melanoma (HCC)   3. Edema of left ankle      Plan:  Patient was evaluated and treated and all questions  answered.  # Rule out subungual malignant melanoma versus nail trauma/dystrophy/nail fungus -Discussed with patient we will send a nail biopsy of her left hallux nail to rule out subungual malignant melanoma -Nail sample was taken including portion of the lesion and will be sent to [facility] lab for analysis -In the meantime do not recommend she put any thing on the nail can do Epsom salt soaks if needed -Do not recommend pedicures or nail polish at this time  -If there is concern for malignant melanoma on the nail biopsy will refer to dermatology versus oncology  # Edema of left ankle -eRx for methylprednisolone 4 mg steroid taper pack take as directed for 6 days discussed the risk and benefits of steroid -Also recommend compression with an ankle style compression brace versus compression socks.  No follow-ups on file.          Bridget Ortiz, Bridget Ortiz [facility] / [facility]

## 2023-09-24 ENCOUNTER — Ambulatory Visit
Admission: EM | Admit: 2023-09-24 | Discharge: 2023-09-24 | Disposition: A | Discharge status: Home / Self Care | Payer: BC Managed Care – PPO

## 2023-09-24 DIAGNOSIS — R109 Unspecified abdominal pain: Secondary | ICD-10-CM

## 2023-09-24 NOTE — ED Provider Notes (Signed)
[facility]    CSN: [id number] Arrival date & time: 09/24/23  1808      History   Chief Complaint Chief Complaint  Patient presents with   Abdominal Pain    HPI Bridget Ortiz is a 47 y.o. female.   Patient presents with abdominal pain that started around 12:00 today.  Reports that it was severe but has slightly subsided now.  Reports it is present in the middle of her abdomen around her bellybutton.  Denies nausea, vomiting, diarrhea.  Patient reports he had a small bowel movement earlier today with no blood in stool.  Denies fever, body aches, chills.  Denies dysuria, urinary frequency, hematuria.  Patient is not sure of last menstrual cycle as she has an IUD.  Has any history of chronic gastrointestinal problems.   Abdominal Pain   Past Medical History:  Diagnosis Date   Hypertension in pregnancy, preeclampsia, delivered     There are no problems to display for this patient.   History reviewed. No pertinent surgical history.  OB History     Gravida  2   Para  2   Term  2   Preterm  0   AB  0   Living  2      SAB  0   IAB  0   Ectopic  0   Multiple  0   Live Births               Home Medications    Prior to Admission medications   Medication Sig Start Date End Date Taking? Authorizing Provider  guaiFENesin-codeine 100-10 MG/5ML syrup Take 10 mLs by mouth every 6 (six) hours as needed for cough. 01/21/15   Geoffery Lyons, MD  levonorgestrel (MIRENA) 20 MCG/24HR IUD 1 each by Intrauterine route once.    [provider]  methylPREDNISolone (MEDROL DOSEPAK) 4 MG TBPK tablet Take as directed for 6 days 06/28/23   Standiford, Jenelle Mages, DPM    Family History Family History  Problem Relation Age of Onset   Diabetes Mother    Hypertension Mother    Cancer Mother        lympnodes   Heart disease Father    Cancer Maternal Grandfather        lung    Social History Social History   Tobacco Use   Smoking status:  Never  Substance Use Topics   Alcohol use: Yes    Alcohol/week: 1.0 - 2.0 standard drink of alcohol    Types: 1 - 2 Glasses of wine per week    Comment: occassionally   Drug use: No     Allergies   Citalopram hydrobromide   Review of Systems Review of Systems Per HPI  Physical Exam Triage Vital Signs ED Triage Vitals  Encounter Vitals Group     BP 09/24/23 1921 105/75     Systolic BP Percentile --      Diastolic BP Percentile --      Pulse Rate 09/24/23 1921 69     Resp --      Temp 09/24/23 1921 98.5 F (36.9 C)     Temp Source 09/24/23 1921 Oral     SpO2 09/24/23 1921 98 %     Weight 09/24/23 1920 220 lb (99.8 kg)     Height 09/24/23 1920 5' 6.5" (1.689 m)     Head Circumference --      Peak Flow --      Pain Score 09/24/23 1919  3     Pain Loc --      Pain Education --      Exclude from Growth Chart --    No data found.  Updated Vital Signs BP 105/75 (BP Location: Left Arm)   Pulse 69   Temp 98.5 F (36.9 C) (Oral)   Ht 5' 6.5" (1.689 m)   Wt 220 lb (99.8 kg)   SpO2 98%   BMI 34.98 kg/m   Visual Acuity Right Eye Distance:   Left Eye Distance:   Bilateral Distance:    Right Eye Near:   Left Eye Near:    Bilateral Near:     Physical Exam Constitutional:      General: She is not in acute distress.    Appearance: Normal appearance. She is not toxic-appearing or diaphoretic.  HENT:     Head: Normocephalic and atraumatic.  Eyes:     Extraocular Movements: Extraocular movements intact.     Conjunctiva/sclera: Conjunctivae normal.  Cardiovascular:     Rate and Rhythm: Normal rate and regular rhythm.     Pulses: Normal pulses.     Heart sounds: Normal heart sounds.  Pulmonary:     Effort: Pulmonary effort is normal. No respiratory distress.     Breath sounds: Normal breath sounds.  Abdominal:     General: Bowel sounds are normal. There is no distension.     Palpations: Abdomen is soft.     Tenderness: There is abdominal tenderness in the  periumbilical area.     Comments: Patient is significantly tender to palpation to mid abdomen around umbilicus.  Neurological:     General: No focal deficit present.     Mental Status: She is alert and oriented to person, place, and time. Mental status is at baseline.  Psychiatric:        Mood and Affect: Mood normal.        Behavior: Behavior normal.        Thought Content: Thought content normal.        Judgment: Judgment normal.      UC Treatments / Results  Labs (all labs ordered are listed, but only abnormal results are displayed) Labs Reviewed - No data to display  EKG   Radiology No results found.  Procedures Procedures (including critical care time)  Medications Ordered in UC Medications - No data to display  Initial Impression / Assessment and Plan / UC Course  I have reviewed the triage vital signs and the nursing notes.  Pertinent labs & imaging results that were available during my care of the patient were reviewed by me and considered in my medical decision making (see chart for details).     Given patient is reporting severe abdominal pain, I do think that more extensive evaluation with imaging of the abdomen is reasonable so advised patient to go to the ER for further evaluation.  Patient was agreeable with plan.  Vital signs stable at discharge.  Agree with the patient's family member transporting her to the ER. Final Clinical Impressions(s) / UC Diagnoses   Final diagnoses:  Abdominal pain, unspecified abdominal location     Discharge Instructions      Please go to the emergency department as soon as you leave urgent care for further evaluation and management.    ED Prescriptions   None    PDMP not reviewed this encounter.   Gustavus Bryant, [place] 09/24/23 [phone]

## 2023-09-24 NOTE — ED Triage Notes (Signed)
Patient presents with abdominal pain today. Treated with Gas X.

## 2023-09-24 NOTE — ED Notes (Signed)
Patient is being discharged from the Urgent Care and sent to the Emergency Department via POV . Per Rolly Salter, NP, patient is in need of higher level of care due to limited resources. Patient is aware and verbalizes understanding of plan of care.  Vitals:   09/24/23 1921  BP: 105/75  Pulse: 69  Temp: 98.5 F (36.9 C)  SpO2: 98%

## 2023-09-24 NOTE — Discharge Instructions (Signed)
Please go to the emergency department as soon as you leave urgent care for further evaluation and management. ?

## 2024-02-08 ENCOUNTER — Other Ambulatory Visit: Payer: Self-pay | Admitting: Obstetrics and Gynecology

## 2024-02-08 DIAGNOSIS — Z1231 Encounter for screening mammogram for malignant neoplasm of breast: Secondary | ICD-10-CM

## 2024-03-25 ENCOUNTER — Ambulatory Visit
Admission: RE | Admit: 2024-03-25 | Discharge: 2024-03-25 | Disposition: A | Discharge status: Home / Self Care | Payer: Self-pay | Source: Ambulatory Visit | Attending: Obstetrics and Gynecology

## 2024-03-25 DIAGNOSIS — Z1231 Encounter for screening mammogram for malignant neoplasm of breast: Secondary | ICD-10-CM

## 2024-03-28 ENCOUNTER — Other Ambulatory Visit: Payer: Self-pay | Admitting: Obstetrics and Gynecology

## 2024-03-28 DIAGNOSIS — R928 Other abnormal and inconclusive findings on diagnostic imaging of breast: Secondary | ICD-10-CM

## 2024-04-15 ENCOUNTER — Ambulatory Visit
Admission: RE | Admit: 2024-04-15 | Discharge: 2024-04-15 | Disposition: A | Discharge status: Home / Self Care | Source: Ambulatory Visit | Attending: Obstetrics and Gynecology | Admitting: Obstetrics and Gynecology

## 2024-04-15 DIAGNOSIS — R928 Other abnormal and inconclusive findings on diagnostic imaging of breast: Secondary | ICD-10-CM

## 2024-05-30 ENCOUNTER — Other Ambulatory Visit: Payer: Self-pay

## 2024-05-30 ENCOUNTER — Encounter (HOSPITAL_BASED_OUTPATIENT_CLINIC_OR_DEPARTMENT_OTHER): Payer: Self-pay

## 2024-05-30 ENCOUNTER — Emergency Department (HOSPITAL_BASED_OUTPATIENT_CLINIC_OR_DEPARTMENT_OTHER)
Admission: EM | Admit: 2024-05-30 | Discharge: 2024-05-30 | Discharge status: Against Medical Advice | Attending: Emergency Medicine | Admitting: Emergency Medicine

## 2024-05-30 DIAGNOSIS — R519 Headache, unspecified: Secondary | ICD-10-CM | POA: Insufficient documentation

## 2024-05-30 DIAGNOSIS — Z5321 Procedure and treatment not carried out due to patient leaving prior to being seen by health care provider: Secondary | ICD-10-CM | POA: Diagnosis not present

## 2024-05-30 NOTE — ED Notes (Signed)
 Pt informed registration she was leaving at this time.

## 2024-05-30 NOTE — ED Triage Notes (Addendum)
 Pt states she has a headache at the front of her head that started some hours ago. Pt states this headache feels more painful that ones she has had in the past. Denies injury, N/V.  Reports some light sensitivity.  Pt states the pain has resolved but it was 10/10 on pain scale.
